# Patient Record
Sex: Male | Born: 1985 | Race: Black or African American | Hispanic: No | Marital: Married | Smoking: Never smoker
Health system: Southern US, Community
[De-identification: ages and names within clinical notes are randomized; demographics above are authoritative.]

## PROBLEM LIST (undated history)

## (undated) ENCOUNTER — Ambulatory Visit: Admission: EM | Source: Home / Self Care

## (undated) DIAGNOSIS — K219 Gastro-esophageal reflux disease without esophagitis: Secondary | ICD-10-CM

## (undated) DIAGNOSIS — L659 Nonscarring hair loss, unspecified: Secondary | ICD-10-CM

## (undated) HISTORY — DX: Gastro-esophageal reflux disease without esophagitis: K21.9

## (undated) HISTORY — DX: Nonscarring hair loss, unspecified: L65.9

---

## 2016-12-20 ENCOUNTER — Ambulatory Visit (INDEPENDENT_AMBULATORY_CARE_PROVIDER_SITE_OTHER): Payer: 59 | Admitting: Family Medicine

## 2016-12-20 VITALS — BP 122/80 | HR 60 | Temp 98.5°F | Resp 16 | Ht 68.0 in | Wt 150.0 lb

## 2016-12-20 DIAGNOSIS — L0291 Cutaneous abscess, unspecified: Secondary | ICD-10-CM

## 2016-12-20 MED ORDER — CEPHALEXIN 750 MG PO CAPS: 750.0000 mg | ORAL_CAPSULE | Freq: Three times a day (TID) | ORAL | 0 refills | Status: DC

## 2016-12-20 NOTE — Progress Notes (Signed)
   Patient ID: Gillermo MurdochKobla Mcconahy, male    DOB: Feb 18, 1986, 31 y.o.   MRN: [id number]  PCP: No primary care provider on file.  Chief Complaint  Patient presents with  . Facial Swelling    Left temple since Monday    Subjective:  HPI 31 year old male presents for evaluation of left temple swelling x 5 days. He is a poor historian of history of present symptoms. Started feeling an aching sensation at the site of left temporal region.  Denies visual disturbances or dizziness. Reports an aching pain for some hours and then minimizes. He took 4 aspirin without relief of symptoms. Denies itching and uncertain of insect bite.Denies feeling feverish or chills. Denies associated headache. Social History   Social History  . Marital status: Married    Spouse name: N/A  . Number of children: N/A  . Years of education: N/A   Occupational History  . Not on file.   Social History Main Topics  . Smoking status: Never Smoker  . Smokeless tobacco: Never Used  . Alcohol use No  . Drug use: No  . Sexual activity: Not on file   Other Topics Concern  . Not on file   Social History Narrative  . No narrative on file    Family History  Problem Relation Age of Onset  . Hypertension Mother   . Stroke Father    Review of Systems See HPI  Prior to Admission medications   Not on File    Past Medical, Surgical Family and Social History reviewed and updated.    Objective:   Today's Vitals   12/20/16 0846  BP: 122/80  Pulse: 60  Resp: 16  Temp: 98.5 F (36.9 C)  TempSrc: Oral  SpO2: 100%  Weight: 150 lb (68 kg)  Height: 5\' 8"  (1.727 m)    Wt Readings from Last 3 Encounters:  12/20/16 150 lb (68 kg)   Physical Exam  Constitutional: He is oriented to person, place, and time. He appears well-developed and well-nourished.  HENT:  Head: Normocephalic and atraumatic.  Left upper temporal / frontal head region mild fluctuated mass. Circular lesion, without defined pustular head  Neck:  Normal range of motion. Neck supple.  Cardiovascular: Normal rate, regular rhythm, normal heart sounds and intact distal pulses.   Pulmonary/Chest: Effort normal and breath sounds normal.  Musculoskeletal: Normal range of motion.  Lymphadenopathy:    He has no cervical adenopathy.  Neurological: He is alert and oriented to person, place, and time.  Skin:  Left upper temporal / frontal head region mild fluctuated mass. Circular lesion, without defined pustular head       Assessment & Plan:  1. Abscess Start Keflex 750 mg three times daily x 7 days. See handout for symptoms associated with a worsening condition.   Godfrey PickKimberly S. Tiburcio PeaHarris, MSN, FNP-C Primary Care at [facility] [phone]

## 2016-12-20 NOTE — Patient Instructions (Addendum)
Start Keflex 750 mg three times daily x 7 days.  See handout for symptoms associated with a worsening condition.   IF you received an x-ray today, you will receive an invoice from [facility]. Please contact [facility] at [phone] with questions or concerns regarding your invoice.   IF you received labwork today, you will receive an invoice from [facility]. Please contact [facility] at [phone] with questions or concerns regarding your invoice.   Our billing staff will not be able to assist you with questions regarding bills from these companies.  You will be contacted with the lab results as soon as they are available. The fastest way to get your results is to activate your My Chart account. Instructions are located on the last page of this paperwork. If you have not heard from us regarding the results in 2 weeks, please contact this office.     Folliculitis Folliculitis is redness, soreness, and swelling (inflammation) of the hair follicles. This condition can occur anywhere on the body. People with weakened immune systems, diabetes, or obesity have a greater risk of getting folliculitis. CAUSES  Bacterial infection. This is the most common cause.  Fungal infection.  Viral infection.  Contact with certain chemicals, especially oils and tars. Long-term folliculitis can result from bacteria that live in the nostrils. The bacteria may trigger multiple outbreaks of folliculitis over time. SYMPTOMS Folliculitis most commonly occurs on the scalp, thighs, legs, back, buttocks, and areas where hair is shaved frequently. An early sign of folliculitis is a small, white or yellow, pus-filled, itchy lesion (pustule). These lesions appear on a red, inflamed follicle. They are usually less than 0.2 inches (5 mm) wide. When there is an infection of the follicle that goes deeper, it becomes a boil or furuncle. A group of closely packed boils creates a larger lesion (carbuncle).  Carbuncles tend to occur in hairy, sweaty areas of the body. DIAGNOSIS  Your caregiver can usually tell what is wrong by doing a physical exam. A sample may be taken from one of the lesions and tested in a lab. This can help determine what is causing your folliculitis. TREATMENT  Treatment may include:  Applying warm compresses to the affected areas.  Taking antibiotic medicines orally or applying them to the skin.  Draining the lesions if they contain a large amount of pus or fluid.  Laser hair removal for cases of long-lasting folliculitis. This helps to prevent regrowth of the hair. HOME CARE INSTRUCTIONS  Apply warm compresses to the affected areas as directed by your caregiver.  If antibiotics are prescribed, take them as directed. Finish them even if you start to feel better.  You may take over-the-counter medicines to relieve itching.  Do not shave irritated skin.  Follow up with your caregiver as directed. SEEK IMMEDIATE MEDICAL CARE IF:   You have increasing redness, swelling, or pain in the affected area.  You have a fever. MAKE SURE YOU:  Understand these instructions.  Will watch your condition.  Will get help right away if you are not doing well or get worse. This information is not intended to replace advice given to you by your health care provider. Make sure you discuss any questions you have with your health care provider. Document Released: 12/30/2001 Document Revised: 11/11/2014 Document Reviewed: 08/11/2015 Elsevier Interactive Patient Education  2017 [employer]

## 2020-06-04 HISTORY — PX: NO PAST SURGERIES: SHX2092

## 2020-07-03 ENCOUNTER — Encounter: Payer: Self-pay | Admitting: Medical

## 2020-07-03 ENCOUNTER — Ambulatory Visit (INDEPENDENT_AMBULATORY_CARE_PROVIDER_SITE_OTHER): Payer: 59 | Admitting: Medical

## 2020-07-03 ENCOUNTER — Other Ambulatory Visit: Payer: Self-pay

## 2020-07-03 VITALS — BP 120/84 | HR 60 | Ht 68.0 in | Wt 146.6 lb

## 2020-07-03 DIAGNOSIS — Z0000 Encounter for general adult medical examination without abnormal findings: Secondary | ICD-10-CM | POA: Insufficient documentation

## 2020-07-03 DIAGNOSIS — Z7189 Other specified counseling: Secondary | ICD-10-CM | POA: Diagnosis not present

## 2020-07-03 DIAGNOSIS — Z7185 Encounter for immunization safety counseling: Secondary | ICD-10-CM

## 2020-07-03 DIAGNOSIS — K30 Functional dyspepsia: Secondary | ICD-10-CM | POA: Diagnosis not present

## 2020-07-03 DIAGNOSIS — Z13220 Encounter for screening for lipoid disorders: Secondary | ICD-10-CM | POA: Insufficient documentation

## 2020-07-03 DIAGNOSIS — R0982 Postnasal drip: Secondary | ICD-10-CM | POA: Diagnosis not present

## 2020-07-03 DIAGNOSIS — Z113 Encounter for screening for infections with a predominantly sexual mode of transmission: Secondary | ICD-10-CM

## 2020-07-03 NOTE — Patient Instructions (Signed)
Recommendations:  Mucous: Likely due to nasal congestion and post nasal drip. [place] is known for heavy pollen and allergies  Consider using a daily antihistamine over the counter at bedtime such as Zyrtec or Allegra  Consider nasal spray daily such as Flonase/Fluticasone nasal over the counter if you don't want to use a pill  Consider doing a daily nasal saline flush in the evening with your shower  Hydrate well with water such as 80-100 ounces of water daily   Using Saline Nose Drops with Bulb Syringe A bulb syringe is used to clear your nose. You may use it when you have a stuffy nose, nasal congestion, sinus pressure, or sneezing.   SALINE SOLUTION You can buy nose drops at your local drug store. You can also make nose drops yourself. Mix 1 cup of water with  teaspoon of salt. Stir. Store this mixture at room temperature. Make a new batch daily.  USE THE BULB IN COMBINATION WITH SALINE NOSE DROPS  Squeeze the air out of the bulb before suctioning the saline mixture.  While still squeezing the bulb flat, place the tip of the bulb into the saline mixture.  Let air come back into the bulb.  This will suction up the saline mixture.  Gently flush one nostril at a time.  Salt water nose drops will then moisten your  congested nose and loosen secretions before suctioning.  Use the bulb syringe as directed below to suction.  USING THE BULB SYRINGE TO SUCTION  While still squeezing the bulb flat, place the tip of the bulb into a nostril. Let air come back into the bulb. The suction will pull snot out of the nose and into the bulb.  Repeat on the other nostril.  Squeeze syringe several times into a tissue.  CLEANING THE BULB SYRINGE  Clean the bulb syringe every day with hot soapy water.  Clean the inside of the bulb by squeezing the bulb while the tip is in soapy water.  Rinse by squeezing the bulb while the tip is in clean hot water.  Store the bulb with the tip  side down on paper towel.  HOME CARE INSTRUCTIONS   Use saline nose drops often to keep the nose open and not stuffy.  Throw away used salt water. Make a new solution every time.  Do not use the same solution and dropper for another person  If you do not prefer to use nasal saline flush, other options include nasal saline spray or the [employer], both of which are available over the counter at your pharmacy.       Indigestion Indigestion is a feeling of pain, discomfort, burning, or fullness in the upper part of your belly (abdomen). It can come and go. It may occur often or rarely. Indigestion tends to happen while you are eating or right after you have finished eating. Indigestion may be a symptom of another condition. It may be worse:  At night.  When bending over.  While lying down. Follow these instructions at home: Eating and drinking   Follow an eating plan as told by your doctor.  You may need to avoid foods and drinks such as: ? Chocolate and cocoa. ? Peppermint and mint flavorings. ? Garlic and onions. ? Horseradish. ? Spicy and acidic foods, such as:  Peppers.  Chili powder and curry powder.  Vinegar.  Hot sauces and BBQ sauce. ? Citrus fruits, such as:  Oranges.  Lemons.  Limes. ? Tomato-based foods,  such as:  Red sauce and pizza with red sauce.  Chili.  Salsa. ? Fried and fatty foods, such as:  Donuts.  [place] fries and potato chips.  High-fat dressings. ? High-fat meats, such as:  Hot dogs and sausage.  Rib eye steak.  Ham and bacon. ? High-fat dairy items, such as:  Whole milk.  Butter.  Cream cheese. ? Coffee and tea (with or without caffeine). ? Drinks that contain alcohol. ? Energy drinks and sports drinks. ? Carbonated drinks or sodas. ? Citrus fruit juices.  Eat small meals often. Avoid eating large meals.  Avoid drinking large amounts of liquid with your meals.  Avoid eating meals during the 2-3 hours before  bedtime.  Avoid lying down right after you eat.  Avoid exercise for 2 hours after you eat. Lifestyle      Maintain a healthy weight. Ask your doctor what weight is healthy for you. If you need to lose weight, work with your doctor.  Exercise for at least 30 minutes on 5 or more days each week, or as told by your doctor. ? Avoid exercises that include bending forward. This can make your symptoms worse.  Wear loose clothes. Do not wear anything tight around your waist.  Do not use any products that contain nicotine or tobacco, including cigarettes, e-cigarettes, and chewing tobacco. These can make your symptoms worse. If you need help quitting, ask your doctor.  Raise (elevate) the head of your bed about 6 inches (15 cm) when you sleep.  Try to lower your stress. If you need help doing this, ask your doctor. General instructions  Take over-the-counter and prescription medicines only as told by your doctor. ? Do not take aspirin, ibuprofen, or other NSAIDs unless your doctor says it is okay.  Pay attention to any changes in your symptoms.  Keep all follow-up visits as told by your doctor. This is important. Contact a doctor if:  You have new symptoms.  You lose weight and you do not know why it is happening.  You have trouble swallowing, or it hurts to swallow.  Your symptoms do not get better with treatment.  Your symptoms last for more than 2 days.  You have a fever.  You throw up (vomit). Get help right away if:  You have pain in your arms, neck, jaw, teeth, or back.  You feel sweaty, dizzy, or light-headed.  You pass out (faint).  You have chest pain or shortness of breath.  You cannot stop throwing up, or you throw up blood.  Your poop (stool) is bloody or black.  You have very bad pain in your belly. These symptoms may represent a serious problem that is an emergency. Do not wait to see if the symptoms will go away. Get medical help right away. Call your  local emergency services (911 in the [place] Do not drive yourself to the hospital. Summary  Indigestion is a feeling of pain, discomfort, burning, or fullness in the upper part of your belly. It tends to happen while you are eating or right after you have finished eating.  Follow an eating plan and other lifestyle changes as told by your doctor.  Take over-the-counter and prescription medicines only as told by your doctor. Do not take aspirin, ibuprofen, or other NSAIDs unless your doctor says it is okay.  Contact your doctor if your symptoms do not get better or they get worse.  Some symptoms may represent a serious problem that is an emergency. Do  not wait to see if the symptoms will go away. Get medical help right away. This information is not intended to replace advice given to you by your health care provider. Make sure you discuss any questions you have with your health care provider. Document Revised: 03/23/2018 Document Reviewed: 03/23/2018 Elsevier Patient Education  2020 [employer].     Other recommendations: See your eye doctor yearly for routine vision care.  See your dentist yearly for routine dental care including hygiene visits twice yearly.  Check your testicles monthly for lumps.  This is a cancer screen  We will call with lab results   Return yearly for a routine physical

## 2020-07-03 NOTE — Progress Notes (Signed)
Subjective:   HPI  Raymond Kelley is a 34 y.o. male who presents for Chief Complaint  Patient presents with   New Patient (Initial Visit)   Annual Exam    Patient Care Team: Nayvie Lips, Kermit Balo, PA-C as PCP - General (Family Medicine) Sees dentist Sees eye doctor  Concerns: Here as a new patient today  He notes some ongoing problems for years with mucus.  Feels congested in the nose, get some mucus stuck in his throat.  Sometimes sneezing.  No itchy or watery eyes.  No frequent infections.  Pretty healthy in general.  Has not tried any medicine for this other than tried Mucinex one time for 1 pill.  Does not like to take medications.  He also notes some feeling like food does not digest at times.  Gets a heaviness, feels like things are not moving through his system after eating.  But denies a lot of belching or burping.  No significant chest pain or abdominal pain.  He has not taken anything for this.  Oddly enough he noticed after his recent first COVID vaccine his symptoms significantly improved for the first time in a while.  He does see specialist for hair loss, takes finasteride daily  He is a non-smoker, only rare alcohol use, no history of asthma.  Last vaccines were done in 2017 when he came here from [place]  Reviewed their medical, surgical, family, social, medication, and allergy history and updated chart as appropriate.  Past Medical History:  Diagnosis Date   Hair thinning     Past Surgical History:  Procedure Laterality Date   NO PAST SURGERIES  06/2020    Family History  Problem Relation Age of Onset   Hypertension Mother    Stroke Father    Diabetes Father    Cancer Neg Hx      Current Outpatient Medications:    finasteride (PROPECIA) 1 MG tablet, Take 1 mg by mouth daily., Disp: , Rfl:   No Known Allergies     Review of Systems Constitutional: -fever, -chills, -sweats, -unexpected weight change, -decreased appetite, -fatigue Allergy:  -sneezing, -itching, -congestion Dermatology: -changing moles, --rash, -lumps ENT: -runny nose, -ear pain, -sore throat, -hoarseness, -sinus pain, -teeth pain, - ringing in ears, -hearing loss, -nosebleeds Cardiology: -chest pain, -palpitations, -swelling, -difficulty breathing when lying flat, -waking up short of breath Respiratory: -cough, -shortness of breath, -difficulty breathing with exercise or exertion, -wheezing, -coughing up blood Gastroenterology: -abdominal pain, -nausea, -vomiting, -diarrhea, -constipation, -blood in stool, -changes in bowel movement, +difficulty swallowing or eating Hematology: -bleeding, -bruising  Musculoskeletal: -joint aches, -muscle aches, -joint swelling, -back pain, -neck pain, -cramping, -changes in gait Ophthalmology: denies vision changes, eye redness, itching, discharge Urology: -burning with urination, -difficulty urinating, -blood in urine, -urinary frequency, -urgency, -incontinence Neurology: -headache, -weakness, -tingling, -numbness, -memory loss, -falls, -dizziness Psychology: -depressed mood, -agitation, -sleep problems Male GU: no testicular mass, pain, no lymph nodes swollen, no swelling, no rash.     Objective:  BP 120/84    Pulse 60    Ht 5\' 8"  (1.727 m)    Wt 146 lb 9.6 oz (66.5 kg)    SpO2 98%    BMI 22.29 kg/m   General appearance: alert, no distress, WD/WN, African American male Skin: unremarkable HEENT: normocephalic, conjunctiva/corneas normal, sclerae anicteric, PERRLA, EOMi, nares with moderate left turbinated edema compared to patent right nostril, no discharge or erythema, pharynx normal Oral cavity: MMM, tongue normal, teeth normal Neck: supple, no lymphadenopathy, no thyromegaly, no  masses, normal ROM, no bruits Chest: non tender, normal shape and expansion Heart: RRR, normal S1, S2, no murmurs Lungs: CTA bilaterally, no wheezes, rhonchi, or rales Abdomen: +bs, soft, non tender, non distended, no masses, no hepatomegaly, no  splenomegaly, no bruits Back: non tender, normal ROM, no scoliosis Musculoskeletal: upper extremities non tender, no obvious deformity, normal ROM throughout, lower extremities non tender, no obvious deformity, normal ROM throughout Extremities: no edema, no cyanosis, no clubbing Pulses: 2+ symmetric, upper and lower extremities, normal cap refill Neurological: alert, oriented x 3, CN2-12 intact, strength normal upper extremities and lower extremities, sensation normal throughout, DTRs 2+ throughout, no cerebellar signs, gait normal Psychiatric: normal affect, behavior normal, pleasant  GU: normal male external genitalia,circumcised, nontender, no masses, no hernia, no lymphadenopathy Rectal: deferred  Assessment and Plan :   Encounter Diagnoses  Name Primary?   Encounter for health maintenance examination in adult Yes   Vaccine counseling    Indigestion    Post-nasal drip    Screening for lipid disorders    Screen for STD (sexually transmitted disease)    Seems healthy in general.  Physical exam - discussed and counseled on healthy lifestyle, diet, exercise, preventative care, vaccinations, sick and well care, proper use of emergency dept and after hours care, and addressed their concerns.    Health screening: See your eye doctor yearly for routine vision care. See your dentist yearly for routine dental care including hygiene visits twice yearly.  Cancer screening Advised monthly self testicular exam   Vaccinations: Advised yearly influenza vaccine He has 2nd covid vaccine coming up in the next 2 weeks  He will get [place] copy of vaccine records   Separate significant issues discussed: Mucous: Likely due to nasal congestion and post nasal drip. [place] is known for heavy pollen and allergies  Consider using a daily antihistamine over the counter at bedtime such as Zyrtec or Allegra  Consider nasal spray daily such as Flonase/Fluticasone nasal over the counter  if you don't want to use a pill  Consider doing a daily nasal saline flush in the evening with your shower  Hydrate well with water such as 80-100 ounces of water daily  We discussed indigestion, preventative measures.  Counseled on healthy diet and exercise.  Baseline STD testing today.  He notes no prior ever STD test.   Raymond Kelley was seen today for new patient (initial visit) and annual exam.  Diagnoses and all orders for this visit:  Encounter for health maintenance examination in adult -     Comprehensive metabolic panel -     CBC -     Lipid panel -     HIV Antibody (routine testing w rflx) -     RPR -     GC/Chlamydia Probe Amp -     Hepatitis B surface antigen -     Hepatitis C antibody  Vaccine counseling  Indigestion  Post-nasal drip  Screening for lipid disorders -     Lipid panel  Screen for STD (sexually transmitted disease) -     HIV Antibody (routine testing w rflx) -     RPR -     GC/Chlamydia Probe Amp -     Hepatitis B surface antigen -     Hepatitis C antibody    Follow-up pending labs, yearly for physical

## 2020-07-04 LAB — LIPID PANEL
Chol/HDL Ratio: 2.8 ratio (ref 0.0–5.0)
Cholesterol, Total: 217 mg/dL — ABNORMAL HIGH (ref 100–199)
HDL: 78 mg/dL (ref >39)
LDL Chol Calc (NIH): 131 mg/dL — ABNORMAL HIGH (ref 0–99)
Triglycerides: 45 mg/dL (ref 0–149)
VLDL Cholesterol Cal: 8 mg/dL (ref 5–40)

## 2020-07-04 LAB — HEPATITIS B SURFACE ANTIGEN: Hepatitis B Surface Ag: NEGATIVE

## 2020-07-04 LAB — CBC
Hematocrit: 46.6 % (ref 37.5–51.0)
Hemoglobin: 15.7 g/dL (ref 13.0–17.7)
MCH: 27.2 pg (ref 26.6–33.0)
MCHC: 33.7 g/dL (ref 31.5–35.7)
MCV: 81 fL (ref 79–97)
Platelets: 184 10*3/uL (ref 150–450)
RBC: 5.77 x10E6/uL (ref 4.14–5.80)
RDW: 13.2 % (ref 11.6–15.4)
WBC: 5.9 10*3/uL (ref 3.4–10.8)

## 2020-07-04 LAB — HEPATITIS C ANTIBODY: Hep C Virus Ab: <0.1 s/co ratio (ref 0.0–0.9)

## 2020-07-04 LAB — COMPREHENSIVE METABOLIC PANEL
ALT: 30 IU/L (ref 0–44)
AST: 23 IU/L (ref 0–40)
Albumin/Globulin Ratio: 1.8 (ref 1.2–2.2)
Albumin: 4.8 g/dL (ref 4.0–5.0)
Alkaline Phosphatase: 81 IU/L (ref 48–121)
BUN/Creatinine Ratio: 10 (ref 9–20)
BUN: 10 mg/dL (ref 6–20)
Bilirubin Total: 0.5 mg/dL (ref 0.0–1.2)
CO2: 21 mmol/L (ref 20–29)
Calcium: 9.8 mg/dL (ref 8.7–10.2)
Chloride: 103 mmol/L (ref 96–106)
Creatinine, Ser: 1 mg/dL (ref 0.76–1.27)
GFR calc Af Amer: 113 mL/min/{1.73_m2} (ref >59)
GFR calc non Af Amer: 98 mL/min/{1.73_m2} (ref >59)
Globulin, Total: 2.6 g/dL (ref 1.5–4.5)
Glucose: 82 mg/dL (ref 65–99)
Potassium: 4.2 mmol/L (ref 3.5–5.2)
Sodium: 139 mmol/L (ref 134–144)
Total Protein: 7.4 g/dL (ref 6.0–8.5)

## 2020-07-04 LAB — RPR: RPR Ser Ql: NONREACTIVE

## 2020-07-04 LAB — GC/CHLAMYDIA PROBE AMP
Chlamydia trachomatis, NAA: NEGATIVE
Neisseria Gonorrhoeae by PCR: NEGATIVE

## 2020-07-04 LAB — HIV ANTIBODY (ROUTINE TESTING W REFLEX): HIV Screen 4th Generation wRfx: NONREACTIVE

## 2020-08-21 ENCOUNTER — Emergency Department (HOSPITAL_COMMUNITY)
Admission: EM | Admit: 2020-08-21 | Discharge: 2020-08-22 | Disposition: A | Discharge status: Home / Self Care | Payer: 59 | Attending: Emergency Medicine | Admitting: Emergency Medicine

## 2020-08-21 ENCOUNTER — Encounter (HOSPITAL_COMMUNITY): Payer: Self-pay

## 2020-08-21 ENCOUNTER — Emergency Department (HOSPITAL_COMMUNITY): Payer: 59

## 2020-08-21 ENCOUNTER — Other Ambulatory Visit: Payer: Self-pay

## 2020-08-21 DIAGNOSIS — R55 Syncope and collapse: Secondary | ICD-10-CM | POA: Insufficient documentation

## 2020-08-21 DIAGNOSIS — R4020 Unspecified coma: Secondary | ICD-10-CM

## 2020-08-21 LAB — COMPREHENSIVE METABOLIC PANEL
ALT: 57 U/L — ABNORMAL HIGH (ref 0–44)
AST: 34 U/L (ref 15–41)
Albumin: 3.9 g/dL (ref 3.5–5.0)
Alkaline Phosphatase: 62 U/L (ref 38–126)
Anion gap: 8 (ref 5–15)
BUN: 11 mg/dL (ref 6–20)
CO2: 21 mmol/L — ABNORMAL LOW (ref 22–32)
Calcium: 8.9 mg/dL (ref 8.9–10.3)
Chloride: 110 mmol/L (ref 98–111)
Creatinine, Ser: 1.06 mg/dL (ref 0.61–1.24)
GFR, Estimated: >60 mL/min (ref >60)
Glucose, Bld: 177 mg/dL — ABNORMAL HIGH (ref 70–99)
Potassium: 3.7 mmol/L (ref 3.5–5.1)
Sodium: 139 mmol/L (ref 135–145)
Total Bilirubin: 0.5 mg/dL (ref 0.3–1.2)
Total Protein: 6.6 g/dL (ref 6.5–8.1)

## 2020-08-21 LAB — CBC WITH DIFFERENTIAL/PLATELET
Abs Immature Granulocytes: 0.02 10*3/uL (ref 0.00–0.07)
Basophils Absolute: 0 10*3/uL (ref 0.0–0.1)
Basophils Relative: 1 %
Eosinophils Absolute: 0.1 10*3/uL (ref 0.0–0.5)
Eosinophils Relative: 1 %
HCT: 43.2 % (ref 39.0–52.0)
Hemoglobin: 14.3 g/dL (ref 13.0–17.0)
Immature Granulocytes: 0 %
Lymphocytes Relative: 27 %
Lymphs Abs: 1.8 10*3/uL (ref 0.7–4.0)
MCH: 27.2 pg (ref 26.0–34.0)
MCHC: 33.1 g/dL (ref 30.0–36.0)
MCV: 82.1 fL (ref 80.0–100.0)
Monocytes Absolute: 0.4 10*3/uL (ref 0.1–1.0)
Monocytes Relative: 6 %
Neutro Abs: 4.3 10*3/uL (ref 1.7–7.7)
Neutrophils Relative %: 65 %
Platelets: 165 10*3/uL (ref 150–400)
RBC: 5.26 MIL/uL (ref 4.22–5.81)
RDW: 12.5 % (ref 11.5–15.5)
WBC: 6.5 10*3/uL (ref 4.0–10.5)
nRBC: 0 % (ref 0.0–0.2)

## 2020-08-21 LAB — CBG MONITORING, ED: Glucose-Capillary: 179 mg/dL — ABNORMAL HIGH (ref 70–99)

## 2020-08-21 MED ORDER — SODIUM CHLORIDE 0.9 % IV BOLUS
1000.0000 mL | Freq: Once | INTRAVENOUS | Status: AC
  Administered 2020-08-21: 1000 mL via INTRAVENOUS

## 2020-08-21 NOTE — ED Provider Notes (Signed)
[facility] EMERGENCY DEPARTMENT Provider Note   CSN: [id number] Arrival date & time: 08/21/20  2004     History Chief Complaint  Patient presents with  . Seizures    Raymond Kelley is a 34 y.o. male without significant past medical hx who presents to the ED with concern for possible seizure shortly PTA.  Patient states that he works night shift, finished a 12 hour shift this AM and was then unable to fall asleep throughout the day today. He started to feel very tired, fatigued, and generally weak. He took a nyquil this evening around 5PM and then shortly prior to arrival he was sitting down at the dinner table when he started to feel lightheaded/dizzy with nausea and his vision started to go blurry with subsequent LOC. Patient was in a chair when this occurred his wife came over and he was slightly conscious and then his eyes rolled back and he started to have some generalized shaking, she held his head/body up, and called 911. This lasted about 2-3 minutes prior to resolution. When patient came back to he felt lightheaded and tired but was not confused. No associated incontinence or biting of his tongue. He still feels tired. He had a similar episode back in 2018. He denies focal weakness, numbness, chest pain, dyspnea, current visual disturbance, abdominal pain, or N/V/D. No recent med changes. Denies drug or alcohol use. No family hx of sudden cardiac death/heart disease.   HPI     Past Medical History:  Diagnosis Date  . Hair thinning     Patient Active Problem List   Diagnosis Date Noted  . Encounter for health maintenance examination in adult 07/03/2020  . Vaccine counseling 07/03/2020  . Indigestion 07/03/2020  . Post-nasal drip 07/03/2020  . Screening for lipid disorders 07/03/2020    Past Surgical History:  Procedure Laterality Date  . NO PAST SURGERIES  06/2020       Family History  Problem Relation Age of Onset  . Hypertension Mother   . Stroke  Father   . Diabetes Father   . Cancer Neg Hx     Social History   Tobacco Use  . Smoking status: Never Smoker  . Smokeless tobacco: Never Used  Substance Use Topics  . Alcohol use: No  . Drug use: No    Home Medications Prior to Admission medications   Medication Sig Start Date End Date Taking? Authorizing Provider  finasteride (PROPECIA) 1 MG tablet Take 1 mg by mouth daily.    [provider]    Allergies    Patient has no known allergies.  Review of Systems   Review of Systems  Constitutional: Negative for chills and fever.  Respiratory: Negative for cough and shortness of breath.   Cardiovascular: Negative for chest pain.  Gastrointestinal: Negative for abdominal pain, blood in stool, constipation, diarrhea and vomiting.  Genitourinary: Negative for dysuria.  Neurological: Positive for seizures, syncope and light-headedness.  All other systems reviewed and are negative.   Physical Exam Updated Vital Signs BP 109/75 (BP Location: Right Arm)   Pulse 63   Temp 98.4 F (36.9 C) (Oral)   Resp (!) 23   Ht 5\' 7"  (1.702 m)   Wt 65.8 kg   SpO2 98%   BMI 22.71 kg/m   Physical Exam Vitals and nursing note reviewed.  Constitutional:      General: He is not in acute distress.    Appearance: Normal appearance. He is not toxic-appearing.  HENT:  Head: Normocephalic and atraumatic.     Mouth/Throat:     Pharynx: Oropharynx is clear. Uvula midline.     Comments: No intra-oral injuries. No tongue abrasions.  Eyes:     General: Vision grossly intact. Gaze aligned appropriately.     Extraocular Movements: Extraocular movements intact.     Right eye: No nystagmus.     Left eye: No nystagmus.     Conjunctiva/sclera:     Right eye: Right conjunctiva is injected. No chemosis, exudate or hemorrhage.    Left eye: Left conjunctiva is injected. No chemosis, exudate or hemorrhage.    Pupils: Pupils are equal, round, and reactive to light.     Comments: No  proptosis.   Cardiovascular:     Rate and Rhythm: Normal rate and regular rhythm.     Heart sounds: No murmur heard.   Pulmonary:     Effort: Pulmonary effort is normal.     Breath sounds: Normal breath sounds.  Abdominal:     General: There is no distension.     Palpations: Abdomen is soft.     Tenderness: There is no abdominal tenderness. There is no guarding or rebound.  Musculoskeletal:     Cervical back: Normal range of motion and neck supple. No rigidity.  Skin:    General: Skin is warm and dry.  Neurological:     Mental Status: He is alert.     Comments: Alert. Clear speech. No facial droop. CNIII-XII grossly intact. Bilateral upper and lower extremities' sensation grossly intact. 5/5 symmetric strength with grip strength and with plantar and dorsi flexion bilaterally . Normal finger to nose bilaterally. Negative pronator drift. Gait intact.    Psychiatric:        Mood and Affect: Mood normal.        Behavior: Behavior normal.     ED Results / Procedures / Treatments   Labs (all labs ordered are listed, but only abnormal results are displayed) Labs Reviewed  COMPREHENSIVE METABOLIC PANEL - Abnormal; Notable for the following components:      Result Value   CO2 21 (*)    Glucose, Bld 177 (*)    ALT 57 (*)    All other components within normal limits  CBG MONITORING, ED - Abnormal; Notable for the following components:   Glucose-Capillary 179 (*)    All other components within normal limits  CBC WITH DIFFERENTIAL/PLATELET    EKG EKG Interpretation  Date/Time:  Monday August 21 2020 20:09:23 EDT Ventricular Rate:  60 PR Interval:    QRS Duration: 113 QT Interval:  361 QTC Calculation: 361 R Axis:   -30 Text Interpretation: Sinus rhythm Left ventricular hypertrophy No previous ECGs available Confirmed by Alvira Monday ([id number]) on 08/21/2020 8:44:16 PM   Radiology CT Head Wo Contrast  Result Date: 08/21/2020 CLINICAL DATA:  Seizure EXAM: CT HEAD WITHOUT  CONTRAST TECHNIQUE: Contiguous axial images were obtained from the base of the skull through the vertex without intravenous contrast. COMPARISON:  None. FINDINGS: Brain: No acute intracranial abnormality. Specifically, no hemorrhage, hydrocephalus, mass lesion, acute infarction, or significant intracranial injury. Vascular: No hyperdense vessel or unexpected calcification. Skull: No acute calvarial abnormality. Sinuses/Orbits: Visualized paranasal sinuses and mastoids clear. Orbital soft tissues unremarkable. Other: None IMPRESSION: Normal study. Electronically Signed   By: Charlett Nose M.D.   On: 08/21/2020 21:20    Procedures Procedures (including critical care time)  Medications Ordered in ED Medications  sodium chloride 0.9 % bolus 1,000 mL (1,000 mLs Intravenous  New Bag/Given 08/21/20 2218)    ED Course  I have reviewed the triage vital signs and the nursing notes.  Pertinent labs & imaging results that were available during my care of the patient were reviewed by me and considered in my medical decision making (see chart for details).    MDM Rules/Calculators/A&P                          Patient presents to the ED with seizure vs. Syncope.  Nontoxic, vitals without significant abnormality. Fairly benign physical exam, no neuro deficits, afebrile w/o nuchal rigidity.    Additional history obtained:  Additional history obtained from his significant other @ the bedside. Previous records obtained and reviewed.   EKG: Sinus rhythm Left ventricular hypertrophy No previous ECGs available-EKG does have somewhat of an abnormal appearance, this was discussed with cardiology fellow Dr. Deforest Hoyles- recommends close outpatient cardiology follow up.   Lab Tests:  I Ordered, reviewed, and interpreted labs, which included:  CBC: No significant anemia.  CMP: Hyperglycemia.  No significant electrolyte derangement.  Orthostatics:  HR: 64  BP: 114/68 Supine HR: 85  BP: 113/83 Sitting HR: 84  BP:  121/61 Standing  Imaging Studies ordered:  I ordered imaging studies which included CT head wo contrast, I independently visualized and interpreted imaging which showed no acute abnormality.   Overall unclear syncope versus seizure, favor syncope given prodrome no postictal state, however will restrict driving and have patient follow-up with neurology as well as cardiology. Patient without significant electrolyte derangement or anemia, CT head without significant abnormality, EKG does appear somewhat abnormal, this was discussed with cardiology, recommend close outpatient follow-up for this. No fever, leukocytosis or specific sxs to raise concern for infectious pathology.  Cardiac monitor without significant arrhythmias while in the department.  Will discharge home with close follow-up as discussed above. I discussed results, treatment plan, need for follow-up, and return precautions with the patient & his significant other. Provided opportunity for questions, patient & significant other confirmed understanding and are in agreement with plan.   Findings and plan of care discussed with supervising physician Dr. Dalene Seltzer who is in agreement.   Portions of this note were generated with Scientist, clinical (histocompatibility and immunogenetics). Dictation errors may occur despite best attempts at proofreading.   Final Clinical Impression(s) / ED Diagnoses Final diagnoses:  Loss of consciousness [facility])    Rx / DC Orders ED Discharge Orders    None       Cherly Anderson, PA-C 08/21/20 2309    Alvira Monday, MD 08/22/20 1456

## 2020-08-21 NOTE — ED Notes (Signed)
HR: 64  BP: 114/68 SUpine HR: 85  BP: 113/83 Sitting HR: 84  BP: 121/61 Standing

## 2020-08-21 NOTE — ED Notes (Signed)
Patient transported to CT 

## 2020-08-21 NOTE — ED Triage Notes (Signed)
Coming with EMS from home after spouse witnessed possible seizure that lasted 1 minute. Pt has no seizure hx. Pt reports that he felt lightheaded this evening.

## 2020-08-21 NOTE — Discharge Instructions (Signed)
You were seen in the emergency department today after possible seizure versus passing out episode.  Your CT of your head was normal.  Your labs were overall reassuring, your blood sugar was somewhat elevated, please have this rechecked by your primary care provider within 1 week.  Your EKG was somewhat abnormal, for this reason and the possibility that she passed out we would like you to follow-up closely with cardiology.  Given you may have had a seizure we would like you to follow-up with neurology.  Please do not drive or operate heavy machinery until you are able to follow-up with neurology for safety purposes.   Please try to stay well rested.  Return to the ER for new or worsening symptoms including but not limited to recurrence of passing out/seizure activity, fever, inability to keep fluids down, chest pain, trouble breathing, heart racing sensation, or any other concerns.

## 2020-08-23 ENCOUNTER — Other Ambulatory Visit: Payer: Self-pay

## 2020-08-23 ENCOUNTER — Encounter: Payer: Self-pay | Admitting: Neurology

## 2020-08-23 ENCOUNTER — Ambulatory Visit (INDEPENDENT_AMBULATORY_CARE_PROVIDER_SITE_OTHER): Payer: 59 | Admitting: Neurology

## 2020-08-23 VITALS — BP 123/73 | HR 85 | Ht 67.0 in | Wt 148.0 lb

## 2020-08-23 DIAGNOSIS — R55 Syncope and collapse: Secondary | ICD-10-CM | POA: Diagnosis not present

## 2020-08-23 NOTE — Progress Notes (Signed)
[facility] [address] [place]. [place] [address] [phone]       OFFICE CONSULT NOTE  Raymond Kelley Date of Birth:  06/21/1986 Medical Record Number:  [id number]   Referring MD: Russella Dar, PA-C Reason for Referral: Syncope versus seizure HPI: Mr. Raymond Kelley is a 34 year old African male from [place] who was seen in the ER 2 days ago for episode of brief loss of consciousness.  Patient states that he works night shift and had not been able to sleep at home during the day.  He took some NyQuil around evening at 5 PM and when he was sitting down to eat he felt lightheaded dizzy and nauseous with some blurred vision and subsequently he passed out briefly.  His wife witnessed and supported his head.  She described that his eyes rolled back in his eyes and he had some generalized shaking lasting for a few minutes only.  Patient started to regain consciousness by the time EMS arrived.  He felt slightly dizzy oriented and tired.  He was taken to the ER at [facility] where CT scan of the head was obtained which was unremarkable and CBC and BMP were normal.  Patient states he has had a somewhat similar episode in 2018 when also he was working night shift.  He went to the bathroom to take a shower and felt a little nauseous and felt his vision was closing up on him and he ended up in the bathtub.  His wife found him there and he was tired and slightly disoriented for a few minutes but was fine.  He did not seek medical help at that time.  He denies any known history of seizures, significant head injury with loss of consciousness, stroke, TIAs or migraine headaches.  He works in a Mid[employer].  He is feeling fine today and wants to go back to work.  He denies any history of chest pain, palpitations, shortness of breath, heart attack angina pulmonary embolism or asthma.   ROS:   14 system review of systems is positive for loss of consciousness, nausea, blurred  vision and disorientation only all other systems negative  PMH:  Past Medical History:  Diagnosis Date  . Hair thinning     Social History:  Social History   Socioeconomic History  . Marital status: Married    Spouse name: Not on file  . Number of children: Not on file  . Years of education: Not on file  . Highest education level: Not on file  Occupational History  . Occupation: Full time/nights  Tobacco Use  . Smoking status: Never Smoker  . Smokeless tobacco: Never Used  Substance and Sexual Activity  . Alcohol use: No  . Drug use: No  . Sexual activity: Not on file  Other Topics Concern  . 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   06/2020   Social Determinants of Health   Financial Resource Strain:   . Difficulty of Paying Living Expenses: Not on file  Food Insecurity:   . Worried About Programme researcher, broadcasting/film/video in the Last Year: Not on file  . Ran Out of Food in the Last Year: Not on file  Transportation Needs:   . Lack of Transportation (Medical): Not on file  .  Lack of Transportation (Non-Medical): Not on file  Physical Activity:   . Days of Exercise per Week: Not on file  . Minutes of Exercise per Session: Not on file  Stress:   . Feeling of Stress : Not on file  Social Connections:   . Frequency of Communication with Friends and Family: Not on file  . Frequency of Social Gatherings with Friends and Family: Not on file  . Attends Religious Services: Not on file  . Active Member of Clubs or Organizations: Not on file  . Attends Banker Meetings: Not on file  . Marital Status: Not on file  Intimate Partner Violence:   . Fear of Current or Ex-Partner: Not on file  . Emotionally Abused: Not on file  . Physically Abused: Not on file  . Sexually Abused: Not on file    Medications:     Current Outpatient Medications on File Prior to Visit  Medication Sig Dispense Refill  . finasteride (PROPECIA) 1 MG tablet Take 1 mg by mouth daily.     No current facility-administered medications on file prior to visit.    Allergies:  No Known Allergies  Physical Exam General: well developed, well nourished young African male, seated, in no evident distress Head: head normocephalic and atraumatic.   Neck: supple with no carotid or supraclavicular bruits Cardiovascular: regular rate and rhythm, no murmurs Musculoskeletal: no deformity Skin:  no rash/petichiae Vascular:  Normal pulses all extremities  Neurologic Exam Mental Status: Awake and fully alert. Oriented to place and time. Recent and remote memory intact. Attention span, concentration and fund of knowledge appropriate. Mood and affect appropriate.  Cranial Nerves: Fundoscopic exam reveals sharp disc margins. Pupils equal, briskly reactive to light. Extraocular movements full without nystagmus. Visual fields full to confrontation. Hearing intact. Facial sensation intact. Face, tongue, palate moves normally and symmetrically.  Motor: Normal bulk and tone. Normal strength in all tested extremity muscles. Sensory.: intact to touch , pinprick , position and vibratory sensation.  Coordination: Rapid alternating movements normal in all extremities. Finger-to-nose and heel-to-shin performed accurately bilaterally. Gait and Station: Arises from chair without difficulty. Stance is normal. Gait demonstrates normal stride length and balance . Able to heel, toe and tandem walk without difficulty.  Reflexes: 1+ and symmetric. Toes downgoing.      ASSESSMENT: 34 year old patient with of recent weakness episode of convulsive syncope with prior episode of unwitnessed episode of brief loss of consciousness 2 years ago most likely syncope.  Doubt seizures.  Unremarkable neurological exam and brain imaging study.     PLAN: I had a long  discussion with the patient regarding his 2 episodes of recurrent brief loss of consciousness possibly representing convulsive syncope though seizure is also possible and vertebrobasilar TIA is less likely.  I recommend further evaluation with checking EEG study, MRI scan of the brain, MRA of the brain and neck, echocardiogram and Holter monitor.  He may return to work but  was advised not to drive for 6 months as per [employer].  I also advised him to avoid sleep deprivation and to sleep 6 to 8 hours every day as well as keep himself well-hydrated.  Greater than 50% time during this 45-minute consultation visit was spent on counseling and coordination of care about his 2 episodes of syncope and discussion between convulsive syncope and seizure and answering questions.  He will return for follow-up in the future in 2 months or call earlier if necessary. Delia Heady, MD Note: This  document was prepared with digital dictation and possible smart phrase technology. Any transcriptional errors that result from this process are unintentional.

## 2020-08-23 NOTE — Patient Instructions (Addendum)
I had a long discussion with the patient regarding his 2 episodes of recurrent brief loss of consciousness possibly representing convulsive syncope though seizure is also possible and vertebrobasilar TIA is less likely.  I recommend further evaluation with checking EEG study, MRI scan of the brain, MRA of the brain and neck, echocardiogram and Holter monitor.  He may return to work but  was advised not to drive for 6 months as per [employer].  I also advised him to avoid sleep deprivation and to sleep 6 to 8 hours every day as well as keep himself well-hydrated.  He will return for follow-up in the future in 2 months or call earlier if necessary.  Syncope Syncope is when you pass out (faint) for a short time. It is caused by a sudden decrease in blood flow to the brain. Signs that you may be about to pass out include:  Feeling dizzy or light-headed.  Feeling sick to your stomach (nauseous).  Seeing all white or all black.  Having cold, clammy skin. If you pass out, get help right away. Call your local emergency services (911 in the [place] Do not drive yourself to the hospital. Follow these instructions at home: Watch for any changes in your symptoms. Take these actions to stay safe and help with your symptoms: Lifestyle  Do not drive, use machinery, or play sports until your doctor says it is okay.  Do not drink alcohol.  Do not use any products that contain nicotine or tobacco, such as cigarettes and e-cigarettes. If you need help quitting, ask your doctor.  Drink enough fluid to keep your pee (urine) pale yellow. General instructions  Take over-the-counter and prescription medicines only as told by your doctor.  If you are taking blood pressure or heart medicine, sit up and stand up slowly. Spend a few minutes getting ready to sit and then stand. This can help you feel less dizzy.  Have someone stay with you until you feel stable.  If you start to feel like you might pass  out, lie down right away and raise (elevate) your feet above the level of your heart. Breathe deeply and steadily. Wait until all of the symptoms are gone.  Keep all follow-up visits as told by your doctor. This is important. Get help right away if:  You have a very bad headache.  You pass out once or more than once.  You have pain in your chest, belly, or back.  You have a very fast or uneven heartbeat (palpitations).  It hurts to breathe.  You are bleeding from your mouth or your bottom (rectum).  You have black or tarry poop (stool).  You have jerky movements that you cannot control (seizure).  You are confused.  You have trouble walking.  You are very weak.  You have vision problems. These symptoms may be an emergency. Do not wait to see if the symptoms will go away. Get medical help right away. Call your local emergency services (911 in the [place] Do not drive yourself to the hospital. Summary  Syncope is when you pass out (faint) for a short time. It is caused by a sudden decrease in blood flow to the brain.  Signs that you may be about to faint include feeling dizzy, light-headed, or sick to your stomach, seeing all white or all black, or having cold, clammy skin.  If you start to feel like you might pass out, lie down right away and raise (elevate) your feet  above the level of your heart. Breathe deeply and steadily. Wait until all of the symptoms are gone. This information is not intended to replace advice given to you by your health care provider. Make sure you discuss any questions you have with your health care provider. Document Revised: 12/03/2017 Document Reviewed: 12/03/2017 Elsevier Patient Education  2020 [employer].

## 2020-08-24 ENCOUNTER — Telehealth: Payer: Self-pay | Admitting: Neurology

## 2020-08-24 NOTE — Telephone Encounter (Signed)
[employer] Berkley Harvey: [id number]. [phone] & [phone] (exp. 08/24/20 to 10/08/20) I was unable to get a hold of the patient her mail box was full.

## 2020-08-25 ENCOUNTER — Ambulatory Visit (INDEPENDENT_AMBULATORY_CARE_PROVIDER_SITE_OTHER): Payer: 59 | Admitting: Medical

## 2020-08-25 ENCOUNTER — Encounter: Payer: Self-pay | Admitting: Medical

## 2020-08-25 ENCOUNTER — Other Ambulatory Visit: Payer: Self-pay

## 2020-08-25 VITALS — BP 124/80 | HR 62 | Ht 68.0 in | Wt 147.2 lb

## 2020-08-25 DIAGNOSIS — G4726 Circadian rhythm sleep disorder, shift work type: Secondary | ICD-10-CM | POA: Diagnosis not present

## 2020-08-25 DIAGNOSIS — R55 Syncope and collapse: Secondary | ICD-10-CM

## 2020-08-25 DIAGNOSIS — R569 Unspecified convulsions: Secondary | ICD-10-CM

## 2020-08-25 DIAGNOSIS — Z72820 Sleep deprivation: Secondary | ICD-10-CM

## 2020-08-25 DIAGNOSIS — R739 Hyperglycemia, unspecified: Secondary | ICD-10-CM

## 2020-08-25 LAB — POCT GLYCOSYLATED HEMOGLOBIN (HGB A1C): Hemoglobin A1C: 5.5 % (ref 4.0–5.6)

## 2020-08-25 LAB — POCT CBG (FASTING - GLUCOSE)-MANUAL ENTRY: Glucose Fasting, POC: 77 mg/dL (ref 70–99)

## 2020-08-25 NOTE — Progress Notes (Signed)
Subjective:  Raymond Kelley is a 34 y.o. male who presents for Chief Complaint  Patient presents with  . Follow-up    feels better after taking pepto      Here for hospital emergency dept follow up.  He was seen August 21, 2020 emergency department for convulsion/syncope.    I reviewed the hospital record the emergency department notes, labs, EKG, CT head.  After reviewing the records we discussed the events leading up to the emergency department visit.  He reports that he works a 12-hour shift, 7 PM to 7 AM through the night.  Likely due to overtime he works 5 or 6 days/week, typically 12-hour days.  So he is working long days.  He notes that he has never really gotten used to this shift working at night.  He prefers a daytime shift.  He notes that he has been working this nighttime shift for at least the last year or more.  He has a hard time falling asleep during the daytime when he should be sleeping.  So he has been on this cycle of working long hours, and not being able to sleep on the downtime.  The other day when the syncope episode happened he was tired, could not sleep and his wife found him in the unresponsive state when she called EMS.  Interestingly when he feels weak or unwell he takes over-the-counter pinworm medication every 3 months.  He did this when he lived back in [place].  He feels that he a decent amount of nutrients but he only eats typically twice daily breakfast and supper  He drives a fork lift at work  He denies snoring, numbness, tingling, weakness, chest pain, palpitations, blurred vision.  He denies any history of diabetes.  He denies blood in the stool.  No belly pain or back pain.  No joint swelling.  He denies any history of hypoglycemia.  He notes in recent weeks to months has been feeling in his usual state of health other than this event recently  No other aggravating or relieving factors.    No other c/o.  The following portions of the patient's history  were reviewed and updated as appropriate: allergies, current medications, past family history, past medical history, past social history, past surgical history and problem list.  ROS Otherwise as in subjective above  Objective: BP 124/80   Pulse 62   Ht 5\' 8"  (1.727 m)   Wt 147 lb 3.2 oz (66.8 kg)   SpO2 98%   BMI 22.38 kg/m   General appearance: alert, no distress, well developed, well nourished, lean African-American male HEENT: normocephalic, sclerae anicteric, conjunctiva pink and moist, TMs pearly, nares patent, no discharge or erythema, pharynx normal Oral cavity: MMM, no lesions Neck: supple, no lymphadenopathy, no thyromegaly, no masses Heart: RRR, normal S1, S2, no murmurs Lungs: CTA bilaterally, no wheezes, rhonchi, or rales Abdomen: +bs, soft, non tender, non distended, no masses, no hepatomegaly, no splenomegaly Pulses: 2+ radial pulses, 2+ pedal pulses, normal cap refill Ext: no edema Neuro: CN II through XII intact, nonfocal exam, no cerebellar abnormality,    Assessment: Encounter Diagnoses  Name Primary?  . Shift work sleep disorder Yes  . Poor sleep   . Hyperglycemia   . Syncope, unspecified syncope type   . Convulsions, unspecified convulsion type (HCC)      Plan: Normal orthostatics Blood glucose and hemoglobin A1c normal I reviewed the recent emergency department notes labs EKG and CT scan of the head  Of note he has cardiology consult Monday in 3 days.  He just saw neurology 2 days ago and has MRA of head and MRA of neck scheduled soon.  I suspect the real issue is lack of good sleep patterns and being overworked with 5 to 6 days/week 12-hour days.  He is likely not getting recharge and refreshed.  We discussed that he may need to either find a way to get better sleep or may need to consider finding a different shift to work as it is unsafe for him to be working not rested, particularly driving a forklift.  We cannot yet rule out an underlying  cardiac or neurological issue, but he seems to be physically healthy otherwise.  I will reach out to neurology to see if they think he needs to try sleep aid in the meantime.  Follow-up as planned with cardiology and for the testing ordered by neurology  Raymond Kelley was seen today for follow-up.  Diagnoses and all orders for this visit:  Shift work sleep disorder  Poor sleep  Hyperglycemia -     Glucose (CBG), Fasting -     HgB A1c  Syncope, unspecified syncope type  Convulsions, unspecified convulsion type (HCC)    Follow up: Follow-up as planned with cardiology and for the testing ordered by neurology

## 2020-08-27 NOTE — Progress Notes (Signed)
Cardiology Office Note:    Date:  08/28/2020   ID:  Raymond Kelley, DOB 1985-11-23, MRN [id number]  PCP:  Jac Canavan, PA-C  Cardiologist:  No primary care provider on file.  Electrophysiologist:  None   Referring MD: Alvira Monday, MD   Chief Complaint  Patient presents with  . Loss of Consciousness    History of Present Illness:    Raymond Kelley is a 34 y.o. male with no significant past medical history who presents as an ED follow-up for syncope.  He was in the ED on 08/21/2020.  He had worked a night shift and was awake the entire day when around 5 PM he was sitting down at the dinner table and began to feel like he going to vomit.  Reports frequently has episodes where he feels like he is not digesting his food.  Reported began feeling lightheaded and vision started to go blurry and then had loss of consciousness.  Wife noted convulsions and called 911.  Episode lasted for about 2 to 3 minutes.  Denied any incontinence, tongue biting.  Reported a similar episode in 2018.  Denies any chest pain or dyspnea.  EKG showed normal sinus rhythm, LVH.  He was discharged from the ED and saw Dr. Pearlean Brownie in neurology on 08/23/2020.  Felt to represent convulsive syncope versus seizure versus vertebrobasilar TIA.  Recommended checking EEG, MRI brain, MRA brain/neck, echocardiogram, and Holter monitor.    Past Medical History:  Diagnosis Date  . Hair thinning     Past Surgical History:  Procedure Laterality Date  . NO PAST SURGERIES  06/2020    Current Medications: Current Meds  Medication Sig  . finasteride (PROPECIA) 1 MG tablet Take 1 mg by mouth daily.     Allergies:   Patient has no known allergies.   Social History   Socioeconomic History  . Marital status: Married    Spouse name: Not on file  . Number of children: Not on file  . Years of education: Not on file  . Highest education level: Not on file  Occupational History  . Occupation: Full time/nights  Tobacco Use   . Smoking status: Never Smoker  . Smokeless tobacco: Never Used  Substance and Sexual Activity  . Alcohol use: No  . Drug use: No  . Sexual activity: Not on file  Other Topics Concern  . 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   06/2020   Social Determinants of Health   Financial Resource Strain:   . Difficulty of Paying Living Expenses: Not on file  Food Insecurity:   . Worried About Programme researcher, broadcasting/film/video in the Last Year: Not on file  . Ran Out of Food in the Last Year: Not on file  Transportation Needs:   . Lack of Transportation (Medical): Not on file  . Lack of Transportation (Non-Medical): Not on file  Physical Activity:   . Days of Exercise per Week: Not on file  . Minutes of Exercise per Session: Not on file  Stress:   . Feeling of Stress : Not on file  Social Connections:   . Frequency of Communication with Friends and Family: Not on file  . Frequency of Social Gatherings with Friends and Family: Not on file  . Attends Religious Services: Not  on file  . Active Member of Clubs or Organizations: Not on file  . Attends Banker Meetings: Not on file  . Marital Status: Not on file     Family History: The patient's family history includes Diabetes in his father; Hypertension in his mother; Stroke in his father. There is no history of Cancer.  ROS:   Please see the history of present illness.     All other systems reviewed and are negative.  EKGs/Labs/Other Studies Reviewed:    The following studies were reviewed today:   EKG:  EKG is not ordered today.  The ekg ordered 08/21/20 today demonstrates normal sinus rhythm, rate 68, QTc 361  Recent Labs: 08/21/2020: ALT 57; BUN 11; Creatinine, Ser 1.06; Hemoglobin 14.3; Platelets 165; Potassium 3.7; Sodium 139  Recent Lipid Panel    Component  Value Date/Time   CHOL 217 (H) 07/03/2020 1103   TRIG 45 07/03/2020 1103   HDL 78 07/03/2020 1103   CHOLHDL 2.8 07/03/2020 1103   LDLCALC 131 (H) 07/03/2020 1103    Physical Exam:    VS:  BP 126/72   Pulse 65   Ht 5\' 7"  (1.702 m)   Wt 150 lb 3.2 oz (68.1 kg)   SpO2 98%   BMI 23.52 kg/m     Wt Readings from Last 3 Encounters:  08/28/20 150 lb 3.2 oz (68.1 kg)  08/25/20 147 lb 3.2 oz (66.8 kg)  08/23/20 148 lb (67.1 kg)     GEN:  Well nourished, well developed in no acute distress HEENT: Normal NECK: No JVD; No carotid bruits LYMPHATICS: No lymphadenopathy CARDIAC: RRR, no murmurs, rubs, gallops RESPIRATORY:  Clear to auscultation without rales, wheezing or rhonchi  ABDOMEN: Soft, non-tender, non-distended MUSCULOSKELETAL:  No edema; No deformity  SKIN: Warm and dry NEUROLOGIC:  Alert and oriented x 3 PSYCHIATRIC:  Normal affect   ASSESSMENT:    1. Syncope and collapse   2. Indigestion    PLAN:    Syncope: Seen by neurology, episode felt to represent convulsive syncope versus seizure versus vertebrobasilar TIA.  Recommended checking EEG, MRI brain, MRA brain/neck, echocardiogram, and cardiac monitor.  Description of prodromal symptoms preceding episode suggest possible vasovagal syncope.  Will follow up results of echocardiogram and cardiac monitor  Indigestion: Reports 2 syncopal episodes were preceded by digestive issues.  Reports frequently has issues with digesting his food, which can lead nausea.  Reports he has been making dietary changes to help with these episodes.  Will refer to gastroenterology for evaluation.  RTC in 3 months   Medication Adjustments/Labs and Tests Ordered: Current medicines are reviewed at length with the patient today.  Concerns regarding medicines are outlined above.  Orders Placed This Encounter  Procedures  . Ambulatory referral to Gastroenterology   No orders of the defined types were placed in this encounter.   Patient  Instructions  Medication Instructions:  Your physician recommends that you continue on your current medications as directed. Please refer to the Current Medication list given to you today.  *If you need a refill on your cardiac medications before your next appointment, please call your pharmacy*  Follow-Up: At [facility], you and your health needs are our priority.  As part of our continuing mission to provide you with exceptional heart care, we have created designated Provider Care Teams.  These Care Teams include your primary Cardiologist (physician) and Advanced Practice Providers (APPs -  Physician Assistants and Nurse Practitioners) who all work together to provide you  with the care you need, when you need it.  We recommend signing up for the patient portal called "MyChart".  Sign up information is provided on this After Visit Summary.  MyChart is used to connect with patients for Virtual Visits (Telemedicine).  Patients are able to view lab/test results, encounter notes, upcoming appointments, etc.  Non-urgent messages can be sent to your provider as well.   To learn more about what you can do with MyChart, go to [email or web address].    Your next appointment:    3 month(s)  The format for your next appointment:   In Person  Provider:   Epifanio Lesches, MD   Other Instructions You have been referred to a Gastroenterologist-they will call to arrange this appointment      Signed, Little Ishikawa, MD  08/28/2020 2:32 PM    [facility]

## 2020-08-28 ENCOUNTER — Encounter: Payer: Self-pay | Admitting: Cardiology

## 2020-08-28 ENCOUNTER — Other Ambulatory Visit: Payer: Self-pay

## 2020-08-28 ENCOUNTER — Ambulatory Visit (INDEPENDENT_AMBULATORY_CARE_PROVIDER_SITE_OTHER): Payer: 59 | Admitting: Cardiology

## 2020-08-28 ENCOUNTER — Encounter: Payer: Self-pay | Admitting: Gastroenterology

## 2020-08-28 VITALS — BP 126/72 | HR 65 | Ht 67.0 in | Wt 150.2 lb

## 2020-08-28 DIAGNOSIS — R55 Syncope and collapse: Secondary | ICD-10-CM | POA: Diagnosis not present

## 2020-08-28 DIAGNOSIS — K30 Functional dyspepsia: Secondary | ICD-10-CM | POA: Diagnosis not present

## 2020-08-28 NOTE — Patient Instructions (Signed)
Medication Instructions:  Your physician recommends that you continue on your current medications as directed. Please refer to the Current Medication list given to you today.  *If you need a refill on your cardiac medications before your next appointment, please call your pharmacy*  Follow-Up: At [facility], you and your health needs are our priority.  As part of our continuing mission to provide you with exceptional heart care, we have created designated Provider Care Teams.  These Care Teams include your primary Cardiologist (physician) and Advanced Practice Providers (APPs -  Physician Assistants and Nurse Practitioners) who all work together to provide you with the care you need, when you need it.  We recommend signing up for the patient portal called "MyChart".  Sign up information is provided on this After Visit Summary.  MyChart is used to connect with patients for Virtual Visits (Telemedicine).  Patients are able to view lab/test results, encounter notes, upcoming appointments, etc.  Non-urgent messages can be sent to your provider as well.   To learn more about what you can do with MyChart, go to [email or web address].    Your next appointment:    3 month(s)  The format for your next appointment:   In Person  Provider:   Epifanio Lesches, MD   Other Instructions You have been referred to a Gastroenterologist-they will call to arrange this appointment

## 2020-08-28 NOTE — Telephone Encounter (Signed)
x2 unable to get a hold of pt mail box is full

## 2020-09-02 ENCOUNTER — Ambulatory Visit (INDEPENDENT_AMBULATORY_CARE_PROVIDER_SITE_OTHER): Payer: 59

## 2020-09-02 DIAGNOSIS — R55 Syncope and collapse: Secondary | ICD-10-CM | POA: Diagnosis not present

## 2020-09-04 ENCOUNTER — Ambulatory Visit (HOSPITAL_COMMUNITY): Admission: RE | Admit: 2020-09-04 | Payer: 59 | Source: Ambulatory Visit

## 2020-09-06 ENCOUNTER — Other Ambulatory Visit: Payer: Self-pay | Admitting: *Deleted

## 2020-09-06 ENCOUNTER — Encounter (HOSPITAL_COMMUNITY): Payer: Self-pay | Admitting: Cardiology

## 2020-09-06 DIAGNOSIS — R55 Syncope and collapse: Secondary | ICD-10-CM

## 2020-09-11 ENCOUNTER — Encounter (HOSPITAL_COMMUNITY): Payer: Self-pay | Admitting: Neurology

## 2020-09-11 ENCOUNTER — Telehealth (HOSPITAL_COMMUNITY): Payer: Self-pay | Admitting: Cardiology

## 2020-09-11 NOTE — Telephone Encounter (Signed)
Just an FYI. We have made several attempts to contact this patient including sending a letter to schedule or reschedule their echocardiogram. We will be removing the patient from the echo WQ.  09/04/20 NO SHOW -MAILED LETTER/LBW       Thank you

## 2020-09-12 ENCOUNTER — Ambulatory Visit (INDEPENDENT_AMBULATORY_CARE_PROVIDER_SITE_OTHER): Payer: 59 | Admitting: Gastroenterology

## 2020-09-12 ENCOUNTER — Encounter: Payer: Self-pay | Admitting: Gastroenterology

## 2020-09-12 VITALS — BP 110/74 | HR 62 | Ht 68.0 in | Wt 148.0 lb

## 2020-09-12 DIAGNOSIS — K219 Gastro-esophageal reflux disease without esophagitis: Secondary | ICD-10-CM | POA: Diagnosis not present

## 2020-09-12 MED ORDER — PANTOPRAZOLE SODIUM 40 MG PO TBEC: 40.0000 mg | DELAYED_RELEASE_TABLET | Freq: Every day | ORAL | 3 refills | Status: DC

## 2020-09-12 NOTE — Patient Instructions (Addendum)
We have sent the following medications to your pharmacy for you to pick up at your convenience: Start Pantoprazole 40mg  daily. - Rx sent to pharmacy.   If you are age 34 or older, your body mass index should be between 23-30. Your Body mass index is 22.5 kg/m. If this is out of the aforementioned range listed, please consider follow up with your Primary Care Provider.  If you are age 74 or younger, your body mass index should be between 19-25. Your Body mass index is 22.5 kg/m. If this is out of the aformentioned range listed, please consider follow up with your Primary Care Provider.    Please keep follow-up in 4-6  weeks with 12-07-1969 Zehr-PA ( 10/12/20 @ 10:30am)  Thank you for choosing me and  Gastroenterology.  14/9/21 Zehr-PA

## 2020-09-12 NOTE — Progress Notes (Signed)
____________________________________________________________  Attending physician addendum:  Thank you for sending this case to me. I have reviewed the entire note, and the outlined plan seems appropriate.  This does not sound likely to be from reflux.  A trial of PPI is reasonable, but I suspect an EGD would be low yield with the reported symptoms. Suonds like he would benefit more from an Allergist consult.  Amada Jupiter, MD  ____________________________________________________________

## 2020-09-12 NOTE — Progress Notes (Signed)
     09/12/2020 Raymond Kelley [id number] 1986-08-08   HISTORY OF PRESENT ILLNESS: This is a 34 year old male who is new to our office.  He moved here from [place] about 4 years ago.  He tells me that shortly after moving here he began having the sensation of a lot of mucus accumulation in his throat.  He says that this only occurs with certain foods such as meats, oatmeal/certain carbohydrates, etc.  He does not notice it with salads or green type vegetables or fish.  He says that when he eats the items that cause this issue then he has to spit up a lot of mucus.  He says that he never had this issue prior to moving to [place].  He does describe some heartburn and reflux sensation intermittently.  He denies any dysphagia.  He says that he does not like to take medication.  The referral was placed by his PCP, Dr. Merceda Elks, PA-C, for evaluation regarding indigestion.  He is currently undergoing some cardiac evaluation and has a cardiac monitor on for one month starting on October 30 for evaluation regarding an episode of syncope.   Past Medical History:  Diagnosis Date  . Hair thinning    Past Surgical History:  Procedure Laterality Date  . NO PAST SURGERIES  06/2020    reports that he has never smoked. He has never used smokeless tobacco. He reports that he does not drink alcohol and does not use drugs. family history includes Diabetes in his father; Hypertension in his mother; Stroke in his father. No Known Allergies    Outpatient Encounter Medications as of 09/12/2020  Medication Sig  . finasteride (PROPECIA) 1 MG tablet Take 1 mg by mouth daily.   No facility-administered encounter medications on file as of 09/12/2020.    REVIEW OF SYSTEMS  : All other systems reviewed and negative except where noted in the History of Present Illness.   PHYSICAL EXAM: BP 110/74   Pulse 62   Ht 5\' 8"  (1.727 m)   Wt 148 lb (67.1 kg)   BMI 22.50 kg/m  General: Well developed  AA male in no acute distress Head: Normocephalic and atraumatic Eyes:  Sclerae anicteric, conjunctiva pink. Ears: Normal auditory acuity Lungs: Clear throughout to auscultation; no W/R/R. Heart: Regular rate and rhythm; no M/R/G. Abdomen: Soft, non-distended.  BS present.  Non-tender. Musculoskeletal: Symmetrical with no gross deformities  Skin: No lesions on visible extremities Extremities: No edema  Neurological: Alert oriented x 4, grossly non-focal Psychological:  Alert and cooperative. Normal mood and affect  ASSESSMENT AND PLAN: *34 year old male who complaints of "a lot of mucus".  I am not really quite sure what this represents.  He describes some heartburn and reflux sensation, but no dysphagia.  He says that this mucus issue only happens with certain foods.  Nonetheless, he has not been on any acid reflux type medication.  He is currently undergoing some cardiac work-up and has a cardiac monitor on right now.  We discussed possibly performing an EGD but I would like him to try some antireflux medication first while undergoing his cardiac evaluation.  I am going to place him on pantoprazole 40 mg daily for the next 4 to 6 weeks at which time we will see him back and reassess his response to the medication and need for endoscopy.  Prescription sent to pharmacy.   CC:  Tysinger, 20, PA-C  CC:  Dr. Kermit Balo

## 2020-09-21 ENCOUNTER — Telehealth (HOSPITAL_COMMUNITY): Payer: Self-pay | Admitting: Neurology

## 2020-09-21 NOTE — Telephone Encounter (Signed)
Just an FYI. We have made several attempts to contact this patient including sending a letter to schedule or reschedule their echocardiogram. We will be removing the patient from the echo WQ.   09/04/20 PT NO SHOWED-MAILED LETTER    Thank you

## 2020-09-25 NOTE — Telephone Encounter (Signed)
Thanks & noted

## 2020-10-12 ENCOUNTER — Ambulatory Visit: Payer: 59 | Admitting: Gastroenterology

## 2020-10-16 ENCOUNTER — Encounter: Payer: Self-pay | Admitting: *Deleted

## 2020-10-16 NOTE — Progress Notes (Signed)
Kindly inform the patient that heart monitoring study did not reveal any evidence of atrial fibrillation or any other worrisome cardiac arrhythmias.  Nothing to worry about

## 2020-10-19 ENCOUNTER — Encounter: Payer: Self-pay | Admitting: Nurse Practitioner

## 2020-10-19 ENCOUNTER — Ambulatory Visit (INDEPENDENT_AMBULATORY_CARE_PROVIDER_SITE_OTHER): Payer: 59 | Admitting: Nurse Practitioner

## 2020-10-19 VITALS — BP 110/74 | HR 68 | Ht 66.0 in | Wt 150.2 lb

## 2020-10-19 DIAGNOSIS — R0989 Other specified symptoms and signs involving the circulatory and respiratory systems: Secondary | ICD-10-CM | POA: Diagnosis not present

## 2020-10-19 NOTE — Patient Instructions (Signed)
Follow up as needed  If you are age 34 or younger, your body mass index should be between 19-25. Your Body mass index is 24.25 kg/m. If this is out of the aformentioned range listed, please consider follow up with your Primary Care Provider.    Due to recent changes in healthcare laws, you may see the results of your imaging and laboratory studies on MyChart before your provider has had a chance to review them.  We understand that in some cases there may be results that are confusing or concerning to you. Not all laboratory results come back in the same time frame and the provider may be waiting for multiple results in order to interpret others.  Please give [place] 48 hours in order for your provider to thoroughly review all the results before contacting the office for clarification of your results.   I appreciate the  opportunity to care for you  Thank You   Midge Minium

## 2020-10-19 NOTE — Progress Notes (Signed)
     ASSESSMENT AND PLAN    # 34 yo male with excessive phlegm in throat / nose related to eating certain foods such as eggs, rice oatmeal and meats. Etiology unclear. He eats other food without difficult and no symptoms when in fasting state.   --Recommend PCP refer him to an Allergist.  --There was a question of GERD symptoms at recent visit but patient doesn't describe any heartburn / reflux symptoms to me and nothing has improved / changed with trial of PPI so okay to stop it.  --follow up prn.   HISTORY OF PRESENT ILLNESS     Primary Gastroenterologist : Amada Jupiter, MD  Chief Complaint : phlegm in throat  Raymond Kelley is a 34 y.o. male with no significant past medical history seen here early November with complaints of excessive mucous in throat and occasional heartburn and was given a trial of PPI. Here for follow up.     Interval History:  Certain foods such as meat, eggs, rice, and oatmeal cause excessive excessive mucous production in his throat but and also sinus drainage.   We discussed heartburn / reflux but contrary to his conversation with Doug Sou PA in early November patient says he has never had actual heartburn or reflux symptoms. Since phlegm problem didn't improve with PPI he stopped taking it a few days ago. He can eat certain food without any problems with phlegm buildup and he has no symptoms during several hours of fasting .The accumulation of phlegm in nose / throat generally starts during or just after eating culprit foods. . He has no dysphagia.  Of note, patient never had these symptoms prior to relocating to [place] 4 years ago.    Past Medical History:  Diagnosis Date  . GERD (gastroesophageal reflux disease)   . Hair thinning     Current Medications, Allergies, Past Surgical History, Family History and Social History were reviewed in [employer] record.   Current Outpatient Medications  Medication Sig Dispense Refill  .  finasteride (PROPECIA) 1 MG tablet Take 1 mg by mouth daily.    . pantoprazole (PROTONIX) 40 MG tablet Take 1 tablet (40 mg total) by mouth daily. 30 tablet 3   No current facility-administered medications for this visit.    Review of Systems: No chest pain. No shortness of breath. No urinary complaints.   PHYSICAL EXAM :    Wt Readings from Last 3 Encounters:  10/19/20 150 lb 4 oz (68.2 kg)  09/12/20 148 lb (67.1 kg)  08/28/20 150 lb 3.2 oz (68.1 kg)    BP 110/74 (BP Location: Left Arm, Patient Position: Sitting, Cuff Size: Normal)   Pulse 68   Ht 5\' 6"  (1.676 m) Comment: height measured without shoes  Wt 150 lb 4 oz (68.2 kg)   BMI 24.25 kg/m  Constitutional:  Pleasant male in no acute distress. Psychiatric: Normal mood and affect. Behavior is normal. EENT: Pupils normal.  Conjunctivae are normal. No scleral icterus. Cardiovascular: Normal rate, regular rhythm. No edema Pulmonary/chest: Effort normal and breath sounds normal. No wheezing, rales or rhonchi. Abdominal: Soft, nondistended, nontender. Bowel sounds active throughout. There are no masses palpable. No hepatomegaly. Neurological: Alert and oriented to person place and time. Skin: Skin is warm and dry. No rashes noted.  , NP  10/19/2020, 10:25 AM

## 2020-10-20 NOTE — Progress Notes (Signed)
____________________________________________________________  Attending physician addendum:  Thank you for sending this case to me. I have reviewed the entire note and agree with the plan.  This does not appear to be GERD.  Agree with allergist evaluation.  Amada Jupiter, MD  ____________________________________________________________

## 2020-11-16 ENCOUNTER — Ambulatory Visit: Payer: 59 | Admitting: Neurology

## 2020-11-26 NOTE — Progress Notes (Deleted)
Cardiology Office Note:    Date:  11/26/2020   ID:  Raymond Kelley, DOB September 01, 1986, MRN [id number]  PCP:  Jac Canavan, PA-C  Cardiologist:  No primary care provider on file.  Electrophysiologist:  None   Referring MD: Jac Canavan, PA-C   No chief complaint on file.   History of Present Illness:    Raymond Kelley is a 35 y.o. male with no significant past medical history who presents for follow-up.  He was initially seen on 08/28/2020 as an ED follow-up for syncope.  He was in the ED on 08/21/2020.  He had worked a night shift and was awake the entire day when around 5 PM he was sitting down at the dinner table and began to feel like he going to vomit.  Reports frequently has episodes where he feels like he is not digesting his food.  Reported began feeling lightheaded and vision started to go blurry and then had loss of consciousness.  Wife noted convulsions and called 911.  Episode lasted for about 2 to 3 minutes.  Denied any incontinence, tongue biting.  Reported a similar episode in 2018.  Denies any chest pain or dyspnea.  EKG showed normal sinus rhythm, LVH.  He was discharged from the ED and saw Dr. Pearlean Brownie in neurology on 08/23/2020.  Felt to represent convulsive syncope versus seizure versus vertebrobasilar TIA.  Recommended checking EEG, MRI brain, MRA brain/neck, echocardiogram, and Holter monitor.    Past Medical History:  Diagnosis Date  . GERD (gastroesophageal reflux disease)   . Hair thinning     Past Surgical History:  Procedure Laterality Date  . NO PAST SURGERIES  06/2020    Current Medications: No outpatient medications have been marked as taking for the 11/29/20 encounter (Appointment) with Little Ishikawa, MD.     Allergies:   Patient has no known allergies.   Social History   Socioeconomic History  . Marital status: Married    Spouse name: Not on file  . Number of children: Not on file  . Years of education: Not on file  . Highest education  level: Not on file  Occupational History  . Occupation: Full time/nights  Tobacco Use  . Smoking status: Never Smoker  . Smokeless tobacco: Never Used  Substance and Sexual Activity  . Alcohol use: No  . Drug use: No  . Sexual activity: Not on file  Other Topics Concern  . 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   06/2020   Social Determinants of Health   Financial Resource Strain: Not on file  Food Insecurity: Not on file  Transportation Needs: Not on file  Physical Activity: Not on file  Stress: Not on file  Social Connections: Not on file     Family History: The patient's family history includes Diabetes in his father; Hypertension in his mother; Stroke in his father. There is no history of Cancer.  ROS:   Please see the history of present illness.     All other systems reviewed and are negative.  EKGs/Labs/Other Studies Reviewed:    The following studies were reviewed today:   EKG:  EKG is not ordered today.  The ekg ordered 08/21/20 today demonstrates normal sinus rhythm, rate 68, QTc 361  Recent Labs: 08/21/2020: ALT 57; BUN  11; Creatinine, Ser 1.06; Hemoglobin 14.3; Platelets 165; Potassium 3.7; Sodium 139  Recent Lipid Panel    Component Value Date/Time   CHOL 217 (H) 07/03/2020 1103   TRIG 45 07/03/2020 1103   HDL 78 07/03/2020 1103   CHOLHDL 2.8 07/03/2020 1103   LDLCALC 131 (H) 07/03/2020 1103    Physical Exam:    VS:  There were no vitals taken for this visit.    Wt Readings from Last 3 Encounters:  10/19/20 150 lb 4 oz (68.2 kg)  09/12/20 148 lb (67.1 kg)  08/28/20 150 lb 3.2 oz (68.1 kg)     GEN:  Well nourished, well developed in no acute distress HEENT: Normal NECK: No JVD; No carotid bruits LYMPHATICS: No lymphadenopathy CARDIAC: RRR, no murmurs, rubs, gallops RESPIRATORY:   Clear to auscultation without rales, wheezing or rhonchi  ABDOMEN: Soft, non-tender, non-distended MUSCULOSKELETAL:  No edema; No deformity  SKIN: Warm and dry NEUROLOGIC:  Alert and oriented x 3 PSYCHIATRIC:  Normal affect   ASSESSMENT:    No diagnosis found. PLAN:    Syncope: Seen by neurology, episode felt to represent convulsive syncope versus seizure versus vertebrobasilar TIA.  Recommended checking EEG, MRI brain, MRA brain/neck, echocardiogram, and cardiac monitor.  Description of prodromal symptoms preceding episode suggest possible vasovagal syncope.  Will follow up results of echocardiogram and cardiac monitor  Indigestion: Reports 2 syncopal episodes were preceded by digestive issues.  Reports frequently has issues with digesting his food, which can lead nausea.  Reports he has been making dietary changes to help with these episodes.  Referred to GI, started on PPI but no improvement and recommended referral to allergy.  RTC in ***   Medication Adjustments/Labs and Tests Ordered: Current medicines are reviewed at length with the patient today.  Concerns regarding medicines are outlined above.  No orders of the defined types were placed in this encounter.  No orders of the defined types were placed in this encounter.   There are no Patient Instructions on file for this visit.   Signed, Little Ishikawa, MD  11/26/2020 2:37 PM    [facility]

## 2020-11-29 ENCOUNTER — Ambulatory Visit: Payer: 59 | Admitting: Cardiology

## 2021-07-31 IMAGING — CT CT HEAD W/O CM
4 series · 15 of 47 positions shown, 17 images · non-contrast
Comparison: None.

CLINICAL DATA: Seizure

EXAM:
CT HEAD WITHOUT CONTRAST
TECHNIQUE: Contiguous axial images were obtained from the base of the skull
through the vertex without intravenous contrast.

[Series 3: head without · axial · non-contrast · 0.46mm/px · z∈[-92,+28]mm · 7 of 34 slices shown, 9 images]
[im 5/34  brain]
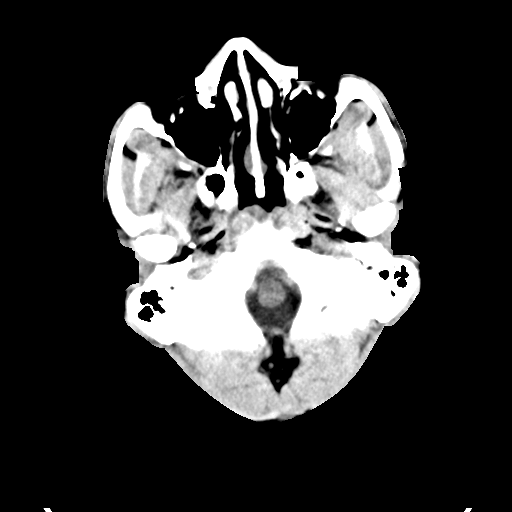
[im 5/34  bone]
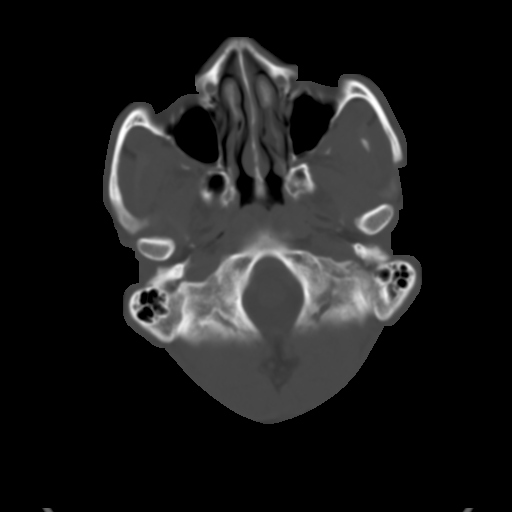
[im 9/34  brain]
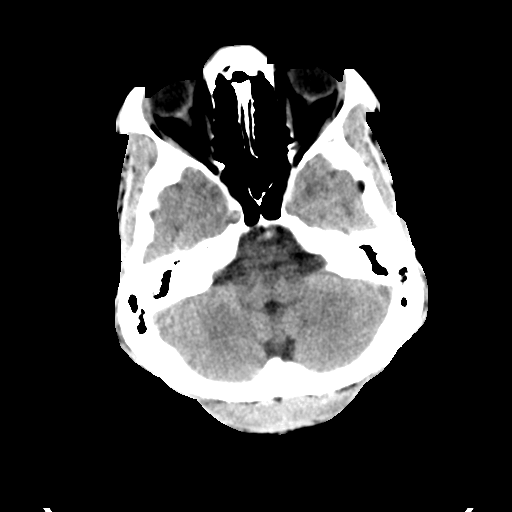
[im 13/34  brain]
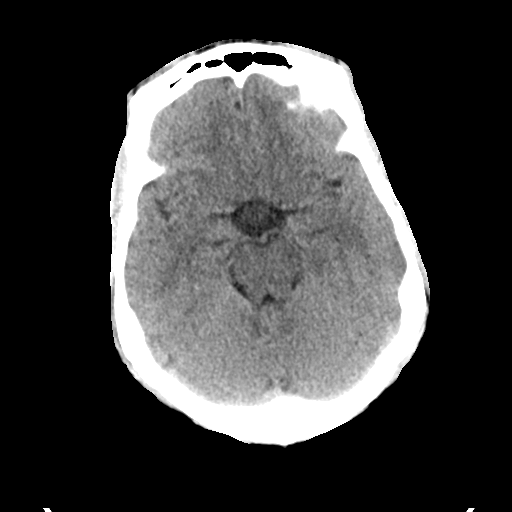
[im 17/34  brain]
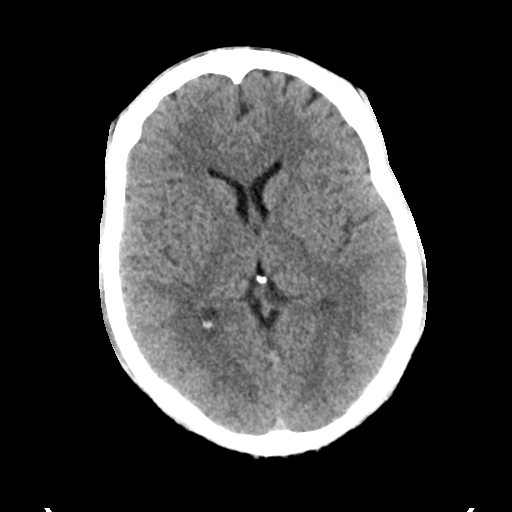
[im 21/34  brain]
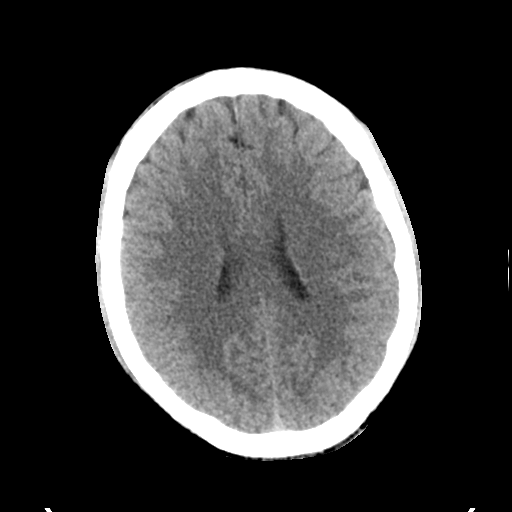
[im 21/34  bone]
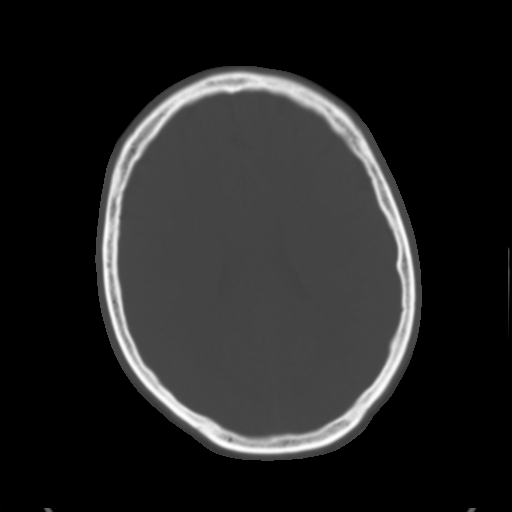
[im 25/34  brain]
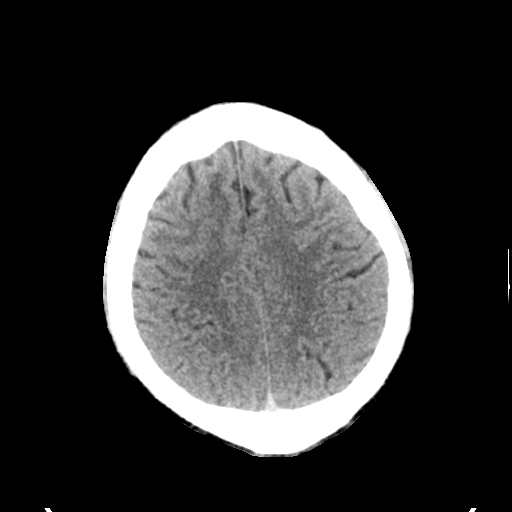
[im 29/34  brain]
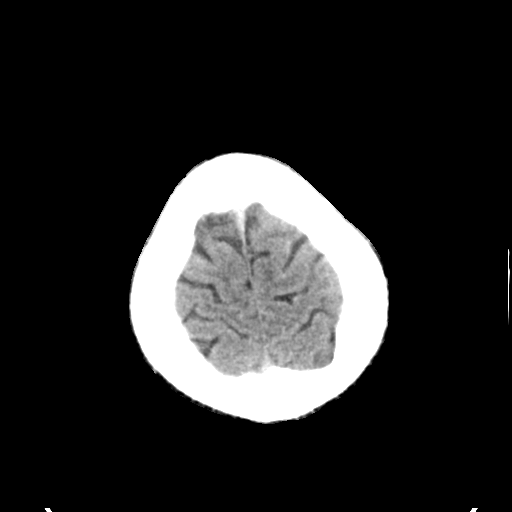

[Series 4: head bone · axial · 0.46mm/px · z∈[-96,-80]mm · 2 of 84 slices shown]
[im 9/84  bone]
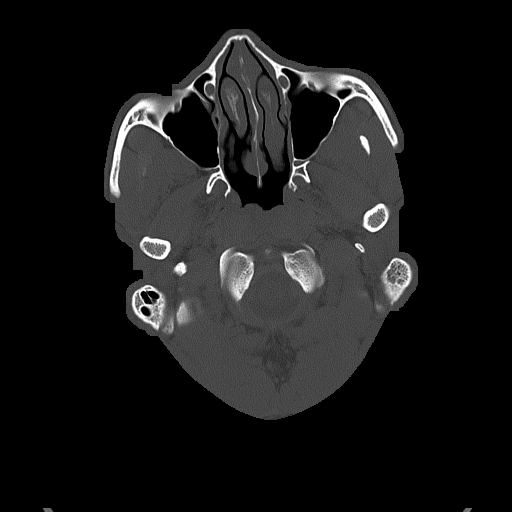
[im 17/84  bone]
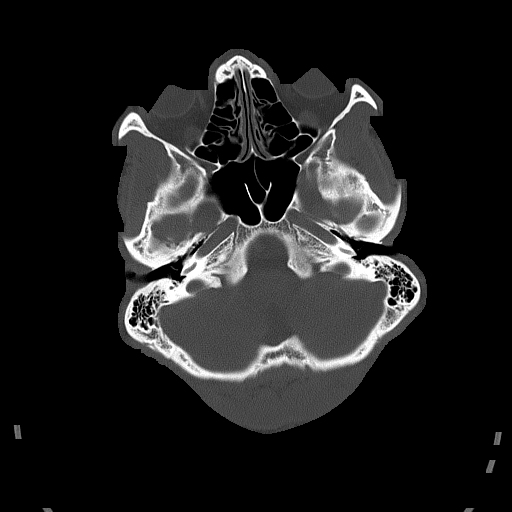

[Series 5: head without cor · coronal · non-contrast · 0.33mm/px · 3 of 71 slices shown]
[im 24/71  brain]
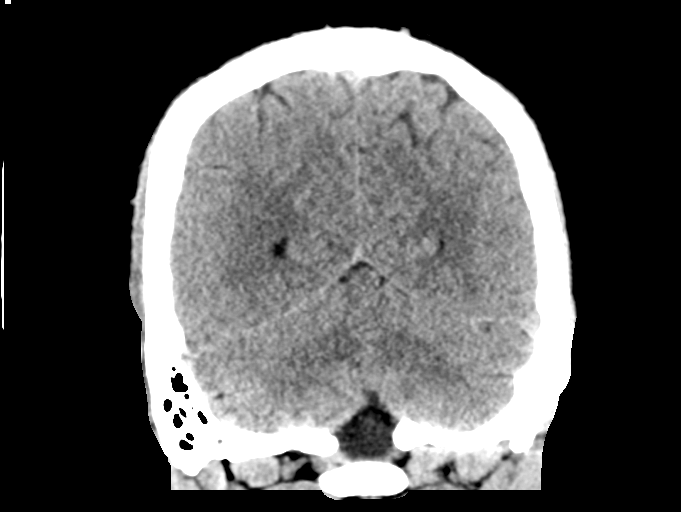
[im 32/71  brain]
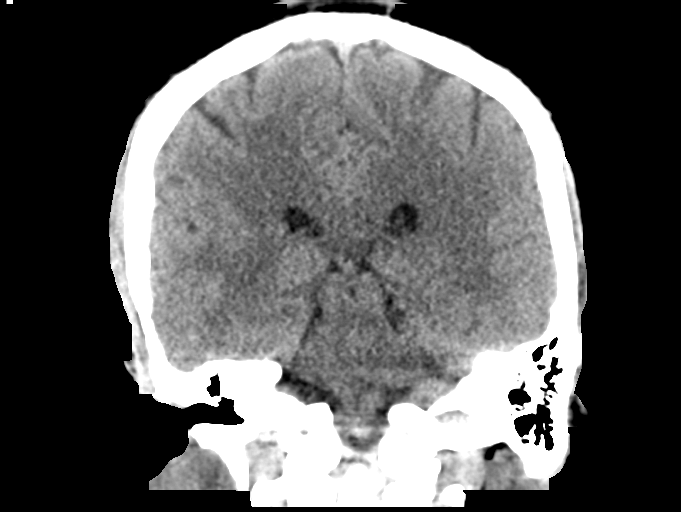
[im 39/71  brain]
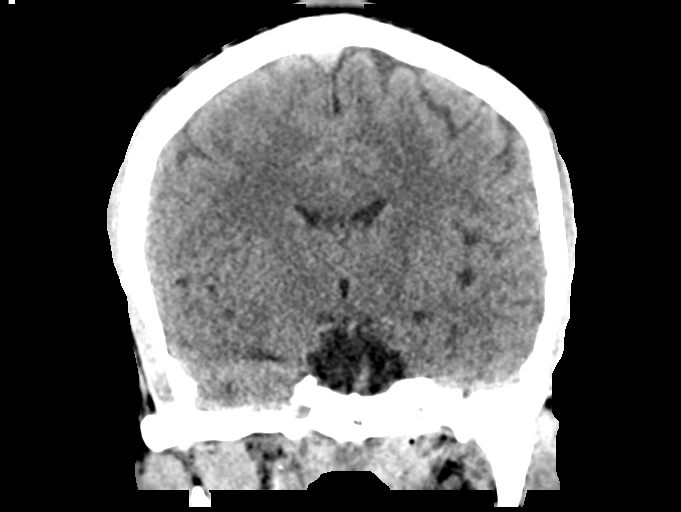

[Series 6: head without sag · sagittal · non-contrast · 0.33mm/px · 3 of 67 slices shown]
[im 23/67  brain]
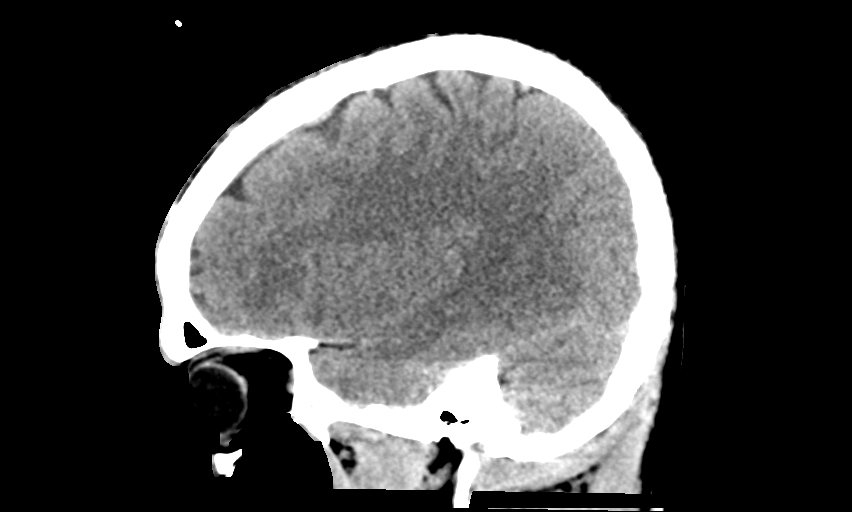
[im 34/67  brain]
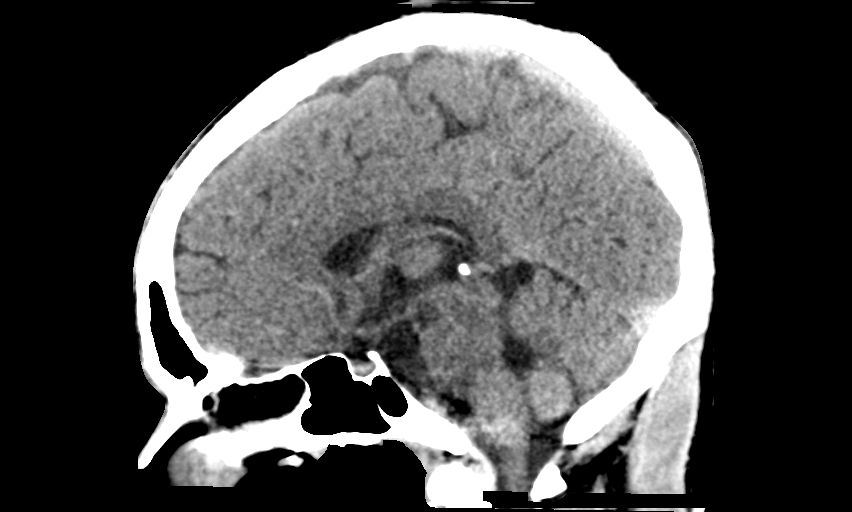
[im 45/67  brain]
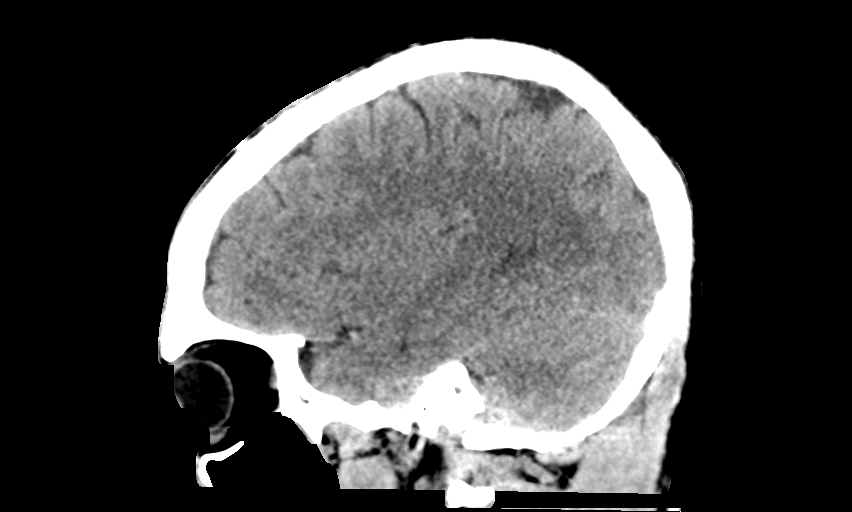

[15 of 47 positions shown; findings below may reference images not displayed]

FINDINGS: Brain: No acute intracranial abnormality. Specifically, no
hemorrhage, hydrocephalus, mass lesion, acute infarction, or
significant intracranial injury.

Vascular: No hyperdense vessel or unexpected calcification.

Skull: No acute calvarial abnormality.

Sinuses/Orbits: Visualized paranasal sinuses and mastoids clear.
Orbital soft tissues unremarkable.

Other: None
IMPRESSION: Normal study.

## 2022-07-10 ENCOUNTER — Encounter: Payer: Self-pay | Admitting: Internal Medicine

## 2022-08-13 ENCOUNTER — Encounter: Payer: Self-pay | Admitting: Internal Medicine
